# Patient Record
Sex: Male | Born: 1995
Health system: Southern US, Community
[De-identification: ages and names within clinical notes are randomized; demographics above are authoritative.]

## PROBLEM LIST (undated history)

## (undated) DIAGNOSIS — T849XXA Unspecified complication of internal orthopedic prosthetic device, implant and graft, initial encounter: Secondary | ICD-10-CM

## (undated) DIAGNOSIS — F909 Attention-deficit hyperactivity disorder, unspecified type: Secondary | ICD-10-CM

## (undated) DIAGNOSIS — K219 Gastro-esophageal reflux disease without esophagitis: Secondary | ICD-10-CM

## (undated) DIAGNOSIS — Z8709 Personal history of other diseases of the respiratory system: Secondary | ICD-10-CM

## (undated) DIAGNOSIS — Z969 Presence of functional implant, unspecified: Secondary | ICD-10-CM

## (undated) HISTORY — PX: INGUINAL HERNIA REPAIR: SHX194

---
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
## 2017-04-30 DIAGNOSIS — R238 Other skin changes: Secondary | ICD-10-CM | POA: Diagnosis not present

## 2017-04-30 DIAGNOSIS — L700 Acne vulgaris: Secondary | ICD-10-CM | POA: Diagnosis not present

## 2017-07-09 DIAGNOSIS — Z681 Body mass index (BMI) 19 or less, adult: Secondary | ICD-10-CM | POA: Diagnosis not present

## 2017-07-09 DIAGNOSIS — Z789 Other specified health status: Secondary | ICD-10-CM | POA: Diagnosis not present

## 2017-07-09 DIAGNOSIS — Z0000 Encounter for general adult medical examination without abnormal findings: Secondary | ICD-10-CM | POA: Diagnosis not present

## 2017-07-09 DIAGNOSIS — R634 Abnormal weight loss: Secondary | ICD-10-CM | POA: Diagnosis not present

## 2018-03-11 DIAGNOSIS — F4321 Adjustment disorder with depressed mood: Secondary | ICD-10-CM | POA: Diagnosis not present

## 2018-03-11 DIAGNOSIS — F329 Major depressive disorder, single episode, unspecified: Secondary | ICD-10-CM | POA: Diagnosis not present

## 2018-03-11 MED FILL — FLUoxetine HCL 10 MG CAPS: 10 | 22 days supply | Qty: 30 | Fill #0

## 2018-05-31 DIAGNOSIS — Z1331 Encounter for screening for depression: Secondary | ICD-10-CM | POA: Diagnosis not present

## 2018-05-31 DIAGNOSIS — Z0000 Encounter for general adult medical examination without abnormal findings: Secondary | ICD-10-CM | POA: Diagnosis not present

## 2018-05-31 DIAGNOSIS — Z1389 Encounter for screening for other disorder: Secondary | ICD-10-CM | POA: Diagnosis not present

## 2018-06-06 ENCOUNTER — Telehealth: Payer: 59 | Admitting: Family

## 2018-06-06 DIAGNOSIS — J020 Streptococcal pharyngitis: Secondary | ICD-10-CM

## 2018-06-06 MED ORDER — AMOXICILLIN-POT CLAVULANATE 875-125 MG PO TABS: 1.0000 | ORAL_TABLET | Freq: Two times a day (BID) | ORAL | 0 refills | Status: AC

## 2018-06-06 NOTE — Progress Notes (Signed)
Thank you for the details you included in the comment boxes. Those details are very helpful in determining the best course of treatment for you and help [place] to provide the best care.  We are sorry that you are not feeling well.  Here is how we plan to help!  Based on what you have shared with me it is likely that you have strep pharyngitis.  Strep pharyngitis is inflammation and infection in the back of the throat.  This is an infection cause by bacteria and is treated with antibiotics.  I have prescribed Augmentin 875 mg/ 125 mg one tablet twice daily for 7 days. For throat pain, we recommend over the counter oral pain relief medications such as acetaminophen or aspirin, or anti-inflammatory medications such as ibuprofen or naproxen sodium. Topical treatments such as oral throat lozenges or sprays may be used as needed. Strep infections are not as easily transmitted as other respiratory infections, however we still recommend that you avoid close contact with loved ones, especially the very young and elderly.  Remember to wash your hands thoroughly throughout the day as this is the number one way to prevent the spread of infection and wipe down door knobs and counters with disinfectant.   Home Care:  Only take medications as instructed by your medical team.  Complete the entire course of an antibiotic.  Do not take these medications with alcohol.  A steam or ultrasonic humidifier can help congestion.  You can place a towel over your head and breathe in the steam from hot water coming from a faucet.  Avoid close contacts especially the very young and the elderly.  Cover your mouth when you cough or sneeze.  Always remember to wash your hands.  Get Help Right Away If:  You develop worsening fever or sinus pain.  You develop a severe head ache or visual changes.  Your symptoms persist after you have completed your treatment plan.  Make sure you  Understand these instructions.  Will  watch your condition.  Will get help right away if you are not doing well or get worse.  Your e-visit answers were reviewed by a board certified advanced clinical practitioner to complete your personal care plan.  Depending on the condition, your plan could have included both over the counter or prescription medications.  If there is a problem please reply  once you have received a response from your provider.  Your safety is important to [place].  If you have drug allergies check your prescription carefully.    You can use MyChart to ask questions about today's visit, request a non-urgent call back, or ask for a work or school excuse for 24 hours related to this e-Visit. If it has been greater than 24 hours you will need to follow up with your provider, or enter a new e-Visit to address those concerns.  You will get an e-mail in the next two days asking about your experience.  I hope that your e-visit has been valuable and will speed your recovery. Thank you for using e-visits.

## 2018-10-16 DIAGNOSIS — J029 Acute pharyngitis, unspecified: Secondary | ICD-10-CM | POA: Diagnosis not present

## 2018-11-22 DIAGNOSIS — Z1159 Encounter for screening for other viral diseases: Secondary | ICD-10-CM | POA: Diagnosis not present

## 2019-01-18 DIAGNOSIS — K130 Diseases of lips: Secondary | ICD-10-CM | POA: Diagnosis not present

## 2019-01-19 DIAGNOSIS — Z20828 Contact with and (suspected) exposure to other viral communicable diseases: Secondary | ICD-10-CM | POA: Diagnosis not present

## 2019-04-28 DIAGNOSIS — Z23 Encounter for immunization: Secondary | ICD-10-CM | POA: Diagnosis not present

## 2019-04-28 DIAGNOSIS — B078 Other viral warts: Secondary | ICD-10-CM | POA: Diagnosis not present

## 2019-05-26 ENCOUNTER — Ambulatory Visit: Payer: 59 | Attending: Internal Medicine

## 2019-08-31 DIAGNOSIS — Z0000 Encounter for general adult medical examination without abnormal findings: Secondary | ICD-10-CM | POA: Diagnosis not present

## 2019-08-31 DIAGNOSIS — Z720 Tobacco use: Secondary | ICD-10-CM | POA: Diagnosis not present

## 2019-08-31 DIAGNOSIS — K582 Mixed irritable bowel syndrome: Secondary | ICD-10-CM | POA: Diagnosis not present

## 2019-10-24 DIAGNOSIS — B349 Viral infection, unspecified: Secondary | ICD-10-CM | POA: Diagnosis not present

## 2019-10-24 DIAGNOSIS — J020 Streptococcal pharyngitis: Secondary | ICD-10-CM | POA: Diagnosis not present

## 2019-12-23 DIAGNOSIS — Z113 Encounter for screening for infections with a predominantly sexual mode of transmission: Secondary | ICD-10-CM | POA: Diagnosis not present

## 2020-03-27 DIAGNOSIS — R070 Pain in throat: Secondary | ICD-10-CM | POA: Diagnosis not present

## 2020-03-27 DIAGNOSIS — J039 Acute tonsillitis, unspecified: Secondary | ICD-10-CM | POA: Diagnosis not present

## 2020-04-13 ENCOUNTER — Other Ambulatory Visit (HOSPITAL_BASED_OUTPATIENT_CLINIC_OR_DEPARTMENT_OTHER): Payer: Self-pay | Admitting: Physician Assistant

## 2020-04-13 DIAGNOSIS — G44009 Cluster headache syndrome, unspecified, not intractable: Secondary | ICD-10-CM | POA: Diagnosis not present

## 2020-04-13 MED FILL — NAPROXEN 500 MG TABS: 500 | 30 days supply | Qty: 60 | Fill #0

## 2021-02-07 ENCOUNTER — Telehealth (INDEPENDENT_AMBULATORY_CARE_PROVIDER_SITE_OTHER): Payer: 59 | Admitting: Family Medicine

## 2021-02-07 ENCOUNTER — Other Ambulatory Visit: Payer: Self-pay

## 2021-02-07 ENCOUNTER — Encounter: Payer: Self-pay | Admitting: Family Medicine

## 2021-02-07 ENCOUNTER — Other Ambulatory Visit (HOSPITAL_BASED_OUTPATIENT_CLINIC_OR_DEPARTMENT_OTHER): Payer: Self-pay

## 2021-02-07 DIAGNOSIS — J029 Acute pharyngitis, unspecified: Secondary | ICD-10-CM

## 2021-02-07 DIAGNOSIS — M791 Myalgia, unspecified site: Secondary | ICD-10-CM

## 2021-02-07 MED ORDER — AZITHROMYCIN 250 MG PO TABS: ORAL_TABLET | ORAL | 0 refills | Status: AC

## 2021-02-07 MED ORDER — AMOXICILLIN 500 MG PO CAPS
500.0000 mg | ORAL_CAPSULE | Freq: Two times a day (BID) | ORAL | 0 refills | Status: AC
  Filled 2021-02-07: qty 20, 10d supply, fill #0

## 2021-02-07 NOTE — Addendum Note (Signed)
Addended by: Myra Rude on: 02/07/2021 01:31 PM   Modules accepted: Orders

## 2021-02-07 NOTE — Assessment & Plan Note (Addendum)
Having body aches and sore throat. Possible for covid vs strep.  - counseled on taking home covid test  - provided amoxicillin. Reports amoxicillin allergy and try azithromycin

## 2021-02-07 NOTE — Progress Notes (Addendum)
Virtual Visit via Telephone Note  I connected with Alejandro Valenzuela on 02/07/21 at 10:30 AM EDT by telephone and verified that I am speaking with the correct person using two identifiers.  Location: Patient: home Provider: office   I discussed the limitations, risks, security and privacy concerns of performing an evaluation and management service by telephone and the availability of in person appointments. I also discussed with the patient that there may be a patient responsible charge related to this service. The patient expressed understanding and agreed to proceed.   History of Present Illness:  Alejandro Valenzuela is a 25 year old male that is presenting with a sore throat and nausea and body aches.  Symptoms have been present for a few days.  Unsure if he has had sick contacts.  Has a history of COVID.   Observations/Objective:   Assessment and Plan:  Having body aches and sore throat. Possible for covid vs strep.  - counseled on taking home covid test  - provided amoxicillin. Reports amoxicillin allergy and try azithromycin  Follow Up Instructions:    I discussed the assessment and treatment plan with the patient. The patient was provided an opportunity to ask questions and all were answered. The patient agreed with the plan and demonstrated an understanding of the instructions.   The patient was advised to call back or seek an in-person evaluation if the symptoms worsen or if the condition fails to improve as anticipated.  I provided 7 minutes of non-face-to-face time during this encounter.   Alejandro Gandy, MD

## 2021-02-18 ENCOUNTER — Other Ambulatory Visit (HOSPITAL_BASED_OUTPATIENT_CLINIC_OR_DEPARTMENT_OTHER): Payer: Self-pay

## 2021-03-21 ENCOUNTER — Other Ambulatory Visit (HOSPITAL_BASED_OUTPATIENT_CLINIC_OR_DEPARTMENT_OTHER): Payer: Self-pay

## 2021-03-21 DIAGNOSIS — R6883 Chills (without fever): Secondary | ICD-10-CM | POA: Diagnosis not present

## 2021-03-21 DIAGNOSIS — R5383 Other fatigue: Secondary | ICD-10-CM | POA: Diagnosis not present

## 2021-03-21 DIAGNOSIS — J029 Acute pharyngitis, unspecified: Secondary | ICD-10-CM | POA: Diagnosis not present

## 2021-03-21 DIAGNOSIS — R5381 Other malaise: Secondary | ICD-10-CM | POA: Diagnosis not present

## 2021-03-21 DIAGNOSIS — R634 Abnormal weight loss: Secondary | ICD-10-CM | POA: Diagnosis not present

## 2021-03-21 DIAGNOSIS — Z20822 Contact with and (suspected) exposure to covid-19: Secondary | ICD-10-CM | POA: Diagnosis not present

## 2021-03-21 MED ORDER — AZITHROMYCIN 500 MG PO TABS
ORAL_TABLET | ORAL | 0 refills | Status: AC
  Filled 2021-03-21: qty 5, 5d supply, fill #0

## 2021-03-21 MED ORDER — LIDOCAINE VISCOUS HCL 2 % MT SOLN
OROMUCOSAL | 1 refills | Status: DC
  Filled 2021-03-21: qty 100, 30d supply, fill #0

## 2021-09-23 ENCOUNTER — Other Ambulatory Visit (HOSPITAL_BASED_OUTPATIENT_CLINIC_OR_DEPARTMENT_OTHER): Payer: Self-pay

## 2021-09-23 DIAGNOSIS — F4323 Adjustment disorder with mixed anxiety and depressed mood: Secondary | ICD-10-CM | POA: Diagnosis not present

## 2021-09-23 MED ORDER — HYDROXYZINE PAMOATE 25 MG PO CAPS
ORAL_CAPSULE | ORAL | 1 refills | Status: AC
  Filled 2021-09-23: qty 30, 10d supply, fill #0

## 2021-11-05 ENCOUNTER — Other Ambulatory Visit (HOSPITAL_COMMUNITY): Payer: Self-pay

## 2021-11-05 DIAGNOSIS — F32A Depression, unspecified: Secondary | ICD-10-CM | POA: Diagnosis not present

## 2021-11-05 DIAGNOSIS — F411 Generalized anxiety disorder: Secondary | ICD-10-CM | POA: Diagnosis not present

## 2021-11-05 MED ORDER — ESCITALOPRAM OXALATE 10 MG PO TABS
10.0000 mg | ORAL_TABLET | Freq: Every day | ORAL | 0 refills | Status: AC
  Filled 2021-11-05: qty 90, 90d supply, fill #0

## 2021-11-11 ENCOUNTER — Other Ambulatory Visit (HOSPITAL_COMMUNITY): Payer: Self-pay

## 2022-09-01 ENCOUNTER — Encounter: Payer: Self-pay | Admitting: *Deleted

## 2023-07-14 DIAGNOSIS — R0982 Postnasal drip: Secondary | ICD-10-CM | POA: Diagnosis not present

## 2023-07-14 DIAGNOSIS — J029 Acute pharyngitis, unspecified: Secondary | ICD-10-CM | POA: Diagnosis not present

## 2023-10-05 ENCOUNTER — Ambulatory Visit: Payer: Commercial Managed Care - PPO | Admitting: Physician Assistant

## 2024-05-14 ENCOUNTER — Encounter (HOSPITAL_BASED_OUTPATIENT_CLINIC_OR_DEPARTMENT_OTHER): Payer: Self-pay

## 2024-05-14 ENCOUNTER — Other Ambulatory Visit: Payer: Self-pay

## 2024-05-14 ENCOUNTER — Emergency Department (HOSPITAL_BASED_OUTPATIENT_CLINIC_OR_DEPARTMENT_OTHER)
Admission: EM | Admit: 2024-05-14 | Discharge: 2024-05-14 | Disposition: A | Discharge status: Home / Self Care | Payer: Self-pay | Attending: Emergency Medicine | Admitting: Emergency Medicine

## 2024-05-14 DIAGNOSIS — W503XXA Accidental bite by another person, initial encounter: Secondary | ICD-10-CM

## 2024-05-14 DIAGNOSIS — S51852A Open bite of left forearm, initial encounter: Secondary | ICD-10-CM | POA: Insufficient documentation

## 2024-05-14 DIAGNOSIS — S0083XA Contusion of other part of head, initial encounter: Secondary | ICD-10-CM | POA: Insufficient documentation

## 2024-05-14 MED ORDER — CLINDAMYCIN HCL 300 MG PO CAPS: 300.0000 mg | ORAL_CAPSULE | Freq: Three times a day (TID) | ORAL | 0 refills | Status: AC

## 2024-05-14 NOTE — Discharge Instructions (Signed)
 Return if any problems.
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
## 2024-05-15 ENCOUNTER — Telehealth: Payer: Self-pay
